# Patient Record
Sex: Male | Born: 1995 | Race: White | Hispanic: No | Marital: Single | Smoking: Current every day smoker
Health system: Southern US, Community
[De-identification: ages and names within clinical notes are randomized; demographics above are authoritative.]

## PROBLEM LIST (undated history)

## (undated) HISTORY — PX: HYDROCELE EXCISION: SHX482

---
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
## 2017-07-18 ENCOUNTER — Other Ambulatory Visit: Payer: Self-pay

## 2017-07-18 ENCOUNTER — Encounter (HOSPITAL_BASED_OUTPATIENT_CLINIC_OR_DEPARTMENT_OTHER): Payer: Self-pay | Admitting: Emergency Medicine

## 2017-07-18 ENCOUNTER — Emergency Department (HOSPITAL_BASED_OUTPATIENT_CLINIC_OR_DEPARTMENT_OTHER)
Admission: EM | Admit: 2017-07-18 | Discharge: 2017-07-18 | Disposition: A | Discharge status: Home / Self Care | Payer: Medicaid Other | Attending: Emergency Medicine | Admitting: Emergency Medicine

## 2017-07-18 DIAGNOSIS — Y990 Civilian activity done for income or pay: Secondary | ICD-10-CM | POA: Diagnosis not present

## 2017-07-18 DIAGNOSIS — S61215A Laceration without foreign body of left ring finger without damage to nail, initial encounter: Secondary | ICD-10-CM | POA: Insufficient documentation

## 2017-07-18 DIAGNOSIS — F17210 Nicotine dependence, cigarettes, uncomplicated: Secondary | ICD-10-CM | POA: Insufficient documentation

## 2017-07-18 DIAGNOSIS — Y929 Unspecified place or not applicable: Secondary | ICD-10-CM | POA: Diagnosis not present

## 2017-07-18 DIAGNOSIS — W268XXA Contact with other sharp object(s), not elsewhere classified, initial encounter: Secondary | ICD-10-CM | POA: Insufficient documentation

## 2017-07-18 DIAGNOSIS — Y9389 Activity, other specified: Secondary | ICD-10-CM | POA: Diagnosis not present

## 2017-07-18 NOTE — Discharge Instructions (Signed)
Sutures out in 7-10 days.

## 2017-07-18 NOTE — ED Triage Notes (Addendum)
Patient states that he cut his left middle finger about 3 hours ago. Dressing on, and bleeding controlled

## 2017-07-18 NOTE — ED Provider Notes (Signed)
[facility] EMERGENCY DEPARTMENT Provider Note   CSN: [id number] Arrival date & time: 07/18/17  1559     History   Chief Complaint Chief Complaint  Patient presents with  . Finger Injury    HPI Paul Dunlap is a 22 y.o. male.  HPI Patient cut his left ring finger on some sheet metal at work.  Was bleeding.  Happened around 3 hours prior to arrival.  No numbness or weakness.  No other injury.  Tetanus is up-to-date. History reviewed. No pertinent past medical history.  There are no active problems to display for this patient.   Past Surgical History:  Procedure Laterality Date  . HYDROCELE EXCISION         Home Medications    Prior to Admission medications   Medication Sig Start Date End Date Taking? Authorizing Provider  buprenorphine-naloxone (SUBOXONE) 8-2 mg SUBL SL tablet Place 2 tablets under the tongue daily.   Yes [provider]  cetirizine (ZYRTEC ALLERGY) 10 MG tablet Take 1 tablet (10 mg total) by mouth daily. 09/28/13   Kirichenko, Tatyana, PA-C  olopatadine (PATANOL) 0.1 % ophthalmic solution Place 1 drop into both eyes 2 (two) times daily. 09/28/13   Jaynie CrumbleKirichenko, Tatyana, PA-C    Family History History reviewed. No pertinent family history.  Social History Social History   Tobacco Use  . Smoking status: Current Every Day Smoker    Packs/day: 1.00    Types: Cigarettes  . Smokeless tobacco: Never Used  Substance Use Topics  . Alcohol use: No  . Drug use: No     Allergies   Patient has no known allergies.   Review of Systems Review of Systems  Constitutional: Negative for chills and fever.  Musculoskeletal:       Left ring finger laceration  Skin: Positive for wound.  Neurological: Negative for weakness and numbness.     Physical Exam Updated Vital Signs BP 127/80 (BP Location: Left Arm)   Pulse 71   Temp 98.1 F (36.7 C) (Oral)   Resp 18   Ht 5\' 10"  (1.778 m)   Wt 65.8 kg (145 lb)   SpO2 100%   BMI 20.81  kg/m    Physical Exam  Constitutional: He appears well-developed.  Musculoskeletal:  Laceration/incision horizontally on the medial aspect of the PIP joint of his left ring finger.  Stable to varus and valgus strain.  Good flexion and extension at that joint and the DIP joint.  No deep lacerations seen.  Sensation intact distally.  Neurological: He is alert.  Skin: Skin is warm.   Finger laceration approximately 1 cm long.  ED Treatments / Results  Labs (all labs ordered are listed, but only abnormal results are displayed) Labs Reviewed - No data to display  EKG  EKG Interpretation None       Radiology No results found.  Procedures .Marland Kitchen.Laceration Repair  Date/Time: 07/18/2017 6:28 PM  Performed by: Benjiman CorePickering, Jacorion Klem, MD  Authorized by: Benjiman CorePickering, Naureen Benton, MD   Consent:    Consent obtained:  Verbal   Consent given by:  Patient   Risks discussed:  Infection, pain, poor cosmetic result and tendon damage   Alternatives discussed:  No treatment Anesthesia (see MAR for exact dosages):    Anesthesia method:  Local infiltration   Local anesthetic:  Lidocaine 1% w/o epi Laceration details:    Location:  Finger   Finger location:  L ring finger   Length (cm):  1 Repair type:    Repair  type:  Simple Pre-procedure details:    Preparation:  Patient was prepped and draped in usual sterile fashion Exploration:    Hemostasis achieved with:  Direct pressure   Wound exploration: wound explored through full range of motion and entire depth of wound probed and visualized     Contaminated: no   Treatment:    Area cleansed with:  Saline   Amount of cleaning:  Standard   Irrigation method:  Syringe Skin repair:    Repair method:  Sutures   Suture size:  5-0   Wound skin closure material used: vicryl rapide.   Suture technique:  Simple interrupted   Number of sutures:  4 Approximation:    Approximation:  Close   Vermilion border: well-aligned   Post-procedure details:     Dressing:  Sterile dressing   Patient tolerance of procedure:  Tolerated well, no immediate complications   (including critical care time)  Medications Ordered in ED Medications - No data to display   Initial Impression / Assessment and Plan / ED Course  I have reviewed the triage vital signs and the nursing notes.  Pertinent labs & imaging results that were available during my care of the patient were reviewed by me and considered in my medical decision making (see chart for details).     Patient with laceration of finger.  No deep wound.  Wound closed by me.  Tetanus is up-to-date.  Follow-up for suture removal in 7-10 days.   Final Clinical Impressions(s) / ED Diagnoses   Final diagnoses:  Laceration of left ring finger without foreign body without damage to nail, initial encounter    ED Discharge Orders    None      Benjiman Core, MD 07/18/17 [phone]
